# Patient Record
Sex: Female | Born: 1960 | Race: White | Hispanic: No | Marital: Married | Smoking: Current every day smoker
Health system: Southern US, Community
[De-identification: ages and names within clinical notes are randomized; demographics above are authoritative.]

## PROBLEM LIST (undated history)

## (undated) DIAGNOSIS — M5030 Other cervical disc degeneration, unspecified cervical region: Secondary | ICD-10-CM

## (undated) DIAGNOSIS — E079 Disorder of thyroid, unspecified: Secondary | ICD-10-CM

## (undated) HISTORY — PX: OTHER SURGICAL HISTORY: SHX169

---

## 2017-09-30 ENCOUNTER — Observation Stay
Admission: EM | Admit: 2017-09-30 | Discharge: 2017-10-02 | Disposition: A | Discharge status: Home / Self Care | Payer: BLUE CROSS/BLUE SHIELD | Attending: Internal Medicine | Admitting: Internal Medicine

## 2017-09-30 ENCOUNTER — Encounter: Payer: Self-pay | Admitting: Emergency Medicine

## 2017-09-30 DIAGNOSIS — M7989 Other specified soft tissue disorders: Secondary | ICD-10-CM | POA: Diagnosis present

## 2017-09-30 DIAGNOSIS — E039 Hypothyroidism, unspecified: Secondary | ICD-10-CM | POA: Insufficient documentation

## 2017-09-30 DIAGNOSIS — I10 Essential (primary) hypertension: Secondary | ICD-10-CM | POA: Insufficient documentation

## 2017-09-30 DIAGNOSIS — F17200 Nicotine dependence, unspecified, uncomplicated: Secondary | ICD-10-CM | POA: Insufficient documentation

## 2017-09-30 DIAGNOSIS — L03113 Cellulitis of right upper limb: Secondary | ICD-10-CM | POA: Diagnosis not present

## 2017-09-30 DIAGNOSIS — R609 Edema, unspecified: Secondary | ICD-10-CM

## 2017-09-30 DIAGNOSIS — L0390 Cellulitis, unspecified: Secondary | ICD-10-CM | POA: Diagnosis present

## 2017-09-30 HISTORY — DX: Disorder of thyroid, unspecified: E07.9

## 2017-09-30 HISTORY — DX: Other cervical disc degeneration, unspecified cervical region: M50.30

## 2017-09-30 NOTE — ED Triage Notes (Signed)
Patient with redness, swelling and blister to right arm that started this morning that has become worse throughout the day. Patient with positive radial pulse.

## 2017-10-01 ENCOUNTER — Emergency Department: Payer: BLUE CROSS/BLUE SHIELD

## 2017-10-01 ENCOUNTER — Encounter: Payer: Self-pay | Admitting: Radiology

## 2017-10-01 DIAGNOSIS — L03113 Cellulitis of right upper limb: Secondary | ICD-10-CM | POA: Diagnosis not present

## 2017-10-01 DIAGNOSIS — L0390 Cellulitis, unspecified: Secondary | ICD-10-CM | POA: Diagnosis present

## 2017-10-01 LAB — CBC WITH DIFFERENTIAL/PLATELET
BASOS ABS: 0 10*3/uL (ref 0–0.1)
BASOS PCT: 0 %
Eosinophils Absolute: 0.1 10*3/uL (ref 0–0.7)
Eosinophils Relative: 1 %
HEMATOCRIT: 37.6 % (ref 35.0–47.0)
HEMOGLOBIN: 13.2 g/dL (ref 12.0–16.0)
Lymphocytes Relative: 24 %
Lymphs Abs: 2.5 10*3/uL (ref 1.0–3.6)
MCH: 31.2 pg (ref 26.0–34.0)
MCHC: 35.2 g/dL (ref 32.0–36.0)
MCV: 88.6 fL (ref 80.0–100.0)
Monocytes Absolute: 0.5 10*3/uL (ref 0.2–0.9)
Monocytes Relative: 5 %
NEUTROS ABS: 7.1 10*3/uL — AB (ref 1.4–6.5)
NEUTROS PCT: 70 %
Platelets: 312 10*3/uL (ref 150–440)
RBC: 4.25 MIL/uL (ref 3.80–5.20)
RDW: 13.8 % (ref 11.5–14.5)
WBC: 10.2 10*3/uL (ref 3.6–11.0)

## 2017-10-01 LAB — BASIC METABOLIC PANEL
Anion gap: 13 (ref 5–15)
BUN: 15 mg/dL (ref 6–20)
CHLORIDE: 100 mmol/L — AB (ref 101–111)
CO2: 24 mmol/L (ref 22–32)
Calcium: 9.5 mg/dL (ref 8.9–10.3)
Creatinine, Ser: 0.76 mg/dL (ref 0.44–1.00)
GFR calc non Af Amer: >60 mL/min (ref >60)
Glucose, Bld: 105 mg/dL — ABNORMAL HIGH (ref 65–99)
POTASSIUM: 3.5 mmol/L (ref 3.5–5.1)
Sodium: 137 mmol/L (ref 135–145)

## 2017-10-01 LAB — SEDIMENTATION RATE: SED RATE: 16 mm/h (ref 0–30)

## 2017-10-01 LAB — LACTIC ACID, PLASMA: LACTIC ACID, VENOUS: 0.7 mmol/L (ref 0.5–1.9)

## 2017-10-01 MED ORDER — ENOXAPARIN SODIUM 40 MG/0.4ML ~~LOC~~ SOLN
40.0000 mg | SUBCUTANEOUS | Status: DC
  Administered 2017-10-01: 40 mg via SUBCUTANEOUS
  Filled 2017-10-01: qty 0.4

## 2017-10-01 MED ORDER — VANCOMYCIN HCL IN DEXTROSE 1-5 GM/200ML-% IV SOLN
1000.0000 mg | Freq: Once | INTRAVENOUS | Status: AC
  Administered 2017-10-01: 1000 mg via INTRAVENOUS
  Filled 2017-10-01: qty 200

## 2017-10-01 MED ORDER — SODIUM CHLORIDE 0.9% FLUSH: 3.0000 mL | INTRAVENOUS | Status: DC | PRN

## 2017-10-01 MED ORDER — PREDNISONE 20 MG PO TABS
40.0000 mg | ORAL_TABLET | Freq: Every day | ORAL | Status: DC
  Administered 2017-10-01 – 2017-10-02 (×2): 40 mg via ORAL
  Filled 2017-10-01 (×2): qty 2

## 2017-10-01 MED ORDER — IOPAMIDOL (ISOVUE-370) INJECTION 76%
100.0000 mL | Freq: Once | INTRAVENOUS | Status: AC | PRN
  Administered 2017-10-01: 100 mL via INTRAVENOUS

## 2017-10-01 MED ORDER — MORPHINE SULFATE (PF) 2 MG/ML IV SOLN
2.0000 mg | Freq: Once | INTRAVENOUS | Status: AC
  Administered 2017-10-01: 2 mg via INTRAVENOUS
  Filled 2017-10-01: qty 1

## 2017-10-01 MED ORDER — ONDANSETRON HCL 4 MG/2ML IJ SOLN: 4.0000 mg | Freq: Four times a day (QID) | INTRAMUSCULAR | Status: DC | PRN

## 2017-10-01 MED ORDER — HYDROCODONE-ACETAMINOPHEN 5-325 MG PO TABS
1.0000 | ORAL_TABLET | ORAL | Status: DC | PRN
  Administered 2017-10-01: 2 via ORAL
  Administered 2017-10-01 (×2): 1 via ORAL
  Administered 2017-10-02 (×2): 2 via ORAL
  Filled 2017-10-01 (×2): qty 1
  Filled 2017-10-01 (×3): qty 2

## 2017-10-01 MED ORDER — DIPHENHYDRAMINE HCL 25 MG PO CAPS: 25.0000 mg | ORAL_CAPSULE | Freq: Four times a day (QID) | ORAL | Status: DC | PRN

## 2017-10-01 MED ORDER — ACETAMINOPHEN 325 MG PO TABS: 650.0000 mg | ORAL_TABLET | Freq: Four times a day (QID) | ORAL | Status: DC | PRN

## 2017-10-01 MED ORDER — ONDANSETRON HCL 4 MG PO TABS: 4.0000 mg | ORAL_TABLET | Freq: Four times a day (QID) | ORAL | Status: DC | PRN

## 2017-10-01 MED ORDER — KETOROLAC TROMETHAMINE 30 MG/ML IJ SOLN
30.0000 mg | Freq: Four times a day (QID) | INTRAMUSCULAR | Status: DC | PRN
  Administered 2017-10-01: 30 mg via INTRAVENOUS
  Filled 2017-10-01: qty 1

## 2017-10-01 MED ORDER — ACETAMINOPHEN 650 MG RE SUPP: 650.0000 mg | Freq: Four times a day (QID) | RECTAL | Status: DC | PRN

## 2017-10-01 MED ORDER — VANCOMYCIN HCL IN DEXTROSE 1-5 GM/200ML-% IV SOLN
1000.0000 mg | Freq: Two times a day (BID) | INTRAVENOUS | Status: DC
  Administered 2017-10-01 – 2017-10-02 (×3): 1000 mg via INTRAVENOUS
  Filled 2017-10-01 (×4): qty 200

## 2017-10-01 MED ORDER — SODIUM CHLORIDE 0.9 % IV SOLN: 250.0000 mL | INTRAVENOUS | Status: DC | PRN

## 2017-10-01 MED ORDER — VANCOMYCIN HCL IN DEXTROSE 1-5 GM/200ML-% IV SOLN: 1000.0000 mg | Freq: Once | INTRAVENOUS | Status: DC

## 2017-10-01 MED ORDER — FENTANYL CITRATE (PF) 100 MCG/2ML IJ SOLN
25.0000 ug | Freq: Once | INTRAMUSCULAR | Status: AC
  Administered 2017-10-01: 25 ug via INTRAVENOUS
  Filled 2017-10-01: qty 2

## 2017-10-01 MED ORDER — SENNOSIDES-DOCUSATE SODIUM 8.6-50 MG PO TABS: 1.0000 | ORAL_TABLET | Freq: Every evening | ORAL | Status: DC | PRN

## 2017-10-01 MED ORDER — SODIUM CHLORIDE 0.9% FLUSH
3.0000 mL | Freq: Two times a day (BID) | INTRAVENOUS | Status: DC
  Administered 2017-10-01 – 2017-10-02 (×3): 3 mL via INTRAVENOUS

## 2017-10-01 MED ORDER — ONDANSETRON HCL 4 MG/2ML IJ SOLN
4.0000 mg | Freq: Once | INTRAMUSCULAR | Status: AC
  Administered 2017-10-01: 4 mg via INTRAVENOUS
  Filled 2017-10-01: qty 2

## 2017-10-01 MED ORDER — THYROID 60 MG PO TABS
60.0000 mg | ORAL_TABLET | Freq: Every day | ORAL | Status: DC
  Administered 2017-10-01 – 2017-10-02 (×2): 60 mg via ORAL
  Filled 2017-10-01 (×4): qty 1

## 2017-10-01 NOTE — Progress Notes (Signed)
The patient complains of blisters and pain on right arm. She said there were some hives and itchon the right arm initially. She denies any contact with plant such as poison ivy. Her vital signs are stable. Physical examination she is unremarkable except blisters and swelling on right arm.  Continue current treatment with vancomycin. Add prednisone and Benadryl when necessary. Pain control.  Time spent 20 minutes.

## 2017-10-01 NOTE — ED Notes (Signed)
Pt transported to room 219 

## 2017-10-01 NOTE — H&P (Signed)
[facility] - [place] [facility]   PATIENT NAME: Crystal Olson    MR#:  [id number]  DATE OF BIRTH:  03-23-1961  DATE OF ADMISSION:  09/30/2017  PRIMARY CARE PHYSICIAN: System, Pcp Not In   REQUESTING/REFERRING PHYSICIAN:   CHIEF COMPLAINT:   Chief Complaint  Patient presents with  . Cellulitis  . Arm Swelling    HISTORY OF PRESENT ILLNESS: Crystal Olson  is a 56 y.o. female with a known history of degenerative disc disease, thyroid disease presented to the emergency room with right forearm swelling. Swelling is present from the wrist up to the lower third of the right arm.The right upper extremity is tense secondary to swelling. Pain is noted which is aching 7 out of 10 on a scale of 1-10.patient traveled from [place] to [place] and en route stayed in [place] at the hotel. Does not remember whether she ever had any insect bite. The swelling in the right upper extremity was noted yesterday morning. It gradually increased in size by evening.After patient presented to the emergency room she also developed 2 blisters over the right upper extremity. Case was seen by surgical attending in the emergency room who recommended no surgical intervention. Patient was worked up with CT angiogram of the right upper extremity which showed no thrombosis. Patient was started on IV antiemetics for cellulitis.  PAST MEDICAL HISTORY:   Past Medical History:  Diagnosis Date  . DDD (degenerative disc disease), cervical   . Thyroid disease     PAST SURGICAL HISTORY: Past Surgical History:  Procedure Laterality Date  . none      SOCIAL HISTORY:  Social History  Substance Use Topics  . Smoking status: Current Every Day Smoker  . Smokeless tobacco: Never Used  . Alcohol use No    FAMILY HISTORY:  Family History  Problem Relation Age of Onset  . Heart disease Mother   . Diabetes Mellitus II Father   . Hypertension Father     DRUG ALLERGIES:  Allergies   Allergen Reactions  . Erythromycin   . Penicillins   . Tetracycline     Other reaction(s): fungus on tongue    REVIEW OF SYSTEMS:   CONSTITUTIONAL: No fever, fatigue or weakness.  EYES: No blurred or double vision.  EARS, NOSE, AND THROAT: No tinnitus or ear pain.  RESPIRATORY: No cough, shortness of breath, wheezing or hemoptysis.  CARDIOVASCULAR: No chest pain, orthopnea, edema.  GASTROINTESTINAL: No nausea, vomiting, diarrhea or abdominal pain.  GENITOURINARY: No dysuria, hematuria.  ENDOCRINE: No polyuria, nocturia,  HEMATOLOGY: No anemia, easy bruising or bleeding SKIN: Blisters over right fore arm MUSCULOSKELETAL: No joint pain or arthritis. Has swelling, pain right fore arm   NEUROLOGIC: No tingling, numbness, weakness.  PSYCHIATRY: No anxiety or depression.   MEDICATIONS AT HOME:  Prior to Admission medications   Medication Sig Start Date End Date Taking? Authorizing Provider  HYDROcodone-acetaminophen (NORCO) 7.5-325 MG tablet Take 1 tablet by mouth every 6 (six) hours as needed. 09/07/17  Yes [provider]  NP THYROID 60 MG tablet Take 1 tablet by mouth daily. 09/26/17  Yes [provider]      PHYSICAL EXAMINATION:   VITAL SIGNS: Blood pressure 113/64, pulse 65, temperature 98.6 F (37 C), temperature source Oral, resp. rate 16, height  (1.575 m), weight 68.9 kg (152 lb), SpO2 97 %.  GENERAL:  56 y.o.-year-old patient lying in the bed with no acute distress.  EYES: Pupils equal, round, reactive to light  and accommodation. No scleral icterus. Extraocular muscles intact.  HEENT: Head atraumatic, normocephalic. Oropharynx and nasopharynx clear.  NECK:  Supple, no jugular venous distention. No thyroid enlargement, no tenderness.  LUNGS: Normal breath sounds bilaterally, no wheezing, rales,rhonchi or crepitation. No use of accessory muscles of respiration.  CARDIOVASCULAR: S1, S2 normal. No murmurs, rubs, or gallops.  ABDOMEN: Soft, nontender,  nondistended. Bowel sounds present. No organomegaly or mass.  EXTREMITIES: No pedal edema, cyanosis, or clubbing.  Has swelling of right fore arm extending from wrist to lower 1/3 rd of arm Blisters noted over right fore arm NEUROLOGIC: Cranial nerves II through XII are intact. Muscle strength 5/5 in all extremities. Sensation intact. Gait not checked.  PSYCHIATRIC: The patient is alert and oriented x 3.  SKIN: No obvious rash, lesion, or ulcer.   LABORATORY PANEL:   CBC  Recent Labs Lab 10/01/17 0104  WBC 10.2  HGB 13.2  HCT 37.6  PLT 312  MCV 88.6  MCH 31.2  MCHC 35.2  RDW 13.8  LYMPHSABS 2.5  MONOABS 0.5  EOSABS 0.1  BASOSABS 0.0   ------------------------------------------------------------------------------------------------------------------  Chemistries   Recent Labs Lab 10/01/17 0104  NA 137  K 3.5  CL 100*  CO2 24  GLUCOSE 105*  BUN 15  CREATININE 0.76  CALCIUM 9.5   ------------------------------------------------------------------------------------------------------------------ estimated creatinine clearance is 70.6 mL/min (by C-G formula based on SCr of 0.76 mg/dL). ------------------------------------------------------------------------------------------------------------------ No results for input(s): TSH, T4TOTAL, T3FREE, THYROIDAB in the last 72 hours.  Invalid input(s): FREET3   Coagulation profile No results for input(s): INR, PROTIME in the last 168 hours. ------------------------------------------------------------------------------------------------------------------- No results for input(s): DDIMER in the last 72 hours. -------------------------------------------------------------------------------------------------------------------  Cardiac Enzymes No results for input(s): CKMB, TROPONINI, MYOGLOBIN in the last 168 hours.  Invalid input(s):  CK ------------------------------------------------------------------------------------------------------------------ Invalid input(s): POCBNP  ---------------------------------------------------------------------------------------------------------------  Urinalysis No results found for: COLORURINE, APPEARANCEUR, LABSPEC, PHURINE, GLUCOSEU, HGBUR, BILIRUBINUR, KETONESUR, PROTEINUR, UROBILINOGEN, NITRITE, LEUKOCYTESUR   RADIOLOGY: Ct Angio Up Extrem Right W &/or Wo Contrast  Result Date: 10/01/2017 CLINICAL DATA:  Acute onset of right arm erythema, swelling and blistering. Initial encounter. EXAM: CT ANGIOGRAPHY OF THE RIGHT UPPER EXTREMITY TECHNIQUE: Multidetector CT imaging of the right upper extremity was performed using the standard protocol during bolus administration of intravenous contrast. Multiplanar CT image reconstructions and MIPs were obtained to evaluate the vascular anatomy. CONTRAST:  100 mL of Isovue 370 IV contrast COMPARISON:  Right upper extremity venous Doppler ultrasound performed earlier today at 1:33 a.m. FINDINGS: There is no evidence of acute arterial thrombosis. The arterial vasculature appears fully patent. No calcific atherosclerotic disease is seen. Mild diffuse soft tissue edema is noted about the upper arm, posterior elbow and mid to distal forearm. Focal blisters are noted along the volar aspect of the forearm, measuring up to 2.5 cm in size. No abnormal focal fluid collections are otherwise seen. There is no evidence of abscess. The musculature is otherwise grossly unremarkable in appearance. No acute osseous abnormalities are seen. Subcortical cysts are noted along the lateral right humeral head. Review of the MIP images confirms the above findings. IMPRESSION: 1. No evidence of acute arterial thrombosis. No calcific atherosclerotic disease seen. 2. Diffuse soft tissue edema about the upper arm, posterior elbow and mid to distal forearm. Focal blisters along the volar  aspect of the forearm, measuring up to 2.5 cm in size. Electronically Signed   By: Roanna Raider M.D.   On: 10/01/2017 02:58   US Venous Img Upper Uni Right  Result Date:  10/01/2017 CLINICAL DATA:  Upper extremity swelling and cellulitis EXAM: Right UPPER EXTREMITY VENOUS DOPPLER ULTRASOUND TECHNIQUE: Gray-scale sonography with graded compression, as well as color Doppler and duplex ultrasound were performed to evaluate the upper extremity deep venous system from the level of the subclavian vein and including the jugular, axillary, basilic, radial, ulnar and upper cephalic vein. Spectral Doppler was utilized to evaluate flow at rest and with distal augmentation maneuvers. COMPARISON:  None. FINDINGS: Contralateral Subclavian Vein: Respiratory phasicity is normal and symmetric with the symptomatic side. No evidence of thrombus. Normal compressibility. Internal Jugular Vein: No evidence of thrombus. Normal compressibility, respiratory phasicity and response to augmentation. Subclavian Vein: No evidence of thrombus. Normal compressibility, respiratory phasicity and response to augmentation. Axillary Vein: No evidence of thrombus. Normal compressibility, respiratory phasicity and response to augmentation. Cephalic Vein: No evidence of thrombus. Normal compressibility, respiratory phasicity and response to augmentation. Basilic Vein: No evidence of thrombus. Normal compressibility, respiratory phasicity and response to augmentation. Brachial Veins: No evidence of thrombus. Normal compressibility, respiratory phasicity and response to augmentation. Radial Veins: No evidence of thrombus. Normal compressibility, respiratory phasicity and response to augmentation. Ulnar Veins: No evidence of thrombus. Normal compressibility, respiratory phasicity and response to augmentation. Venous Reflux:  None visualized. Other Findings:  None visualized. IMPRESSION: No evidence of DVT within the right upper extremity.  Electronically Signed   By: Ellery Plunk M.D.   On: 10/01/2017 01:45    EKG: No orders found for this or any previous visit.  IMPRESSION AND PLAN: 56 year old female patient with history of thyroid disease presented to the emergency room with swelling of the right forearm extending up to the arm and blisters of the skin.  Admitting diagnosis 1. Right upper extremity cellulitis 2. Right arm pain 3. Thyroid disease Treatment plan Admit patient to medical floor Start patient on IV vancomycin antibiotic Elevate the right upper extremity Appreciate surgical consultation Pain management with IV ketorolac  All the records are reviewed and case discussed with ED provider. Management plans discussed with the patient, family and they are in agreement.  CODE STATUS:FULL CODE Code Status History    This patient does not have a recorded code status. Please follow your organizational policy for patients in this situation.       TOTAL TIME TAKING CARE OF THIS PATIENT: 50 minutes.    Ihor Austin M.D on 10/01/2017 at 6:27 AM  Between 7am to 6pm - Pager - [phone]  After 6pm go to [email or web address] - password [email or web address]  [facility]  Office  [phone]  CC: Primary care physician; System, Pcp Not In
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
## 2017-10-01 NOTE — ED Notes (Signed)
David RN, aware of bed assigned  

## 2017-10-01 NOTE — Progress Notes (Signed)
Pharmacy Antibiotic Note  Crystal Olson is a 56 y.o. female admitted on 09/30/2017 with cellulitis.  Pharmacy has been consulted for vancomycin dosing.  Plan: Patient received vanc 1g IV x 1 in ED. Will f/u w/ vanc 1g IV q12h w/ 6 hr stack dose. Will draw vanc trough 10/15 @ 1800 prior to 4th dose.  Ke 0.06299 T1/2 12 hrs Goal trough 10 - 15 mcg/mL  Height:  (157.5 cm) Weight: 152 lb (68.9 kg) IBW/kg (Calculated) : 50.1  Temp (24hrs), Avg:98.6 F (37 C), Min:98.6 F (37 C), Max:98.6 F (37 C)   Recent Labs Lab 10/01/17 0104  WBC 10.2  CREATININE 0.76  LATICACIDVEN 0.7    Estimated Creatinine Clearance: 70.6 mL/min (by C-G formula based on SCr of 0.76 mg/dL).    Allergies  Allergen Reactions  . Erythromycin   . Penicillins   . Tetracycline     Other reaction(s): fungus on tongue    Thank you for allowing pharmacy to be a part of this patient's care.  Thomasene Ripple, PharmD, BCPS Clinical Pharmacist 10/01/2017

## 2017-10-01 NOTE — ED Notes (Signed)
ED Provider at bedside. 

## 2017-10-01 NOTE — ED Notes (Signed)
Patient transported to CT 

## 2017-10-02 LAB — CBC
HCT: 37.7 % (ref 35.0–47.0)
HEMOGLOBIN: 12.7 g/dL (ref 12.0–16.0)
MCH: 30.2 pg (ref 26.0–34.0)
MCHC: 33.6 g/dL (ref 32.0–36.0)
MCV: 89.8 fL (ref 80.0–100.0)
Platelets: 281 10*3/uL (ref 150–440)
RBC: 4.2 MIL/uL (ref 3.80–5.20)
RDW: 13.7 % (ref 11.5–14.5)
WBC: 11.7 10*3/uL — ABNORMAL HIGH (ref 3.6–11.0)

## 2017-10-02 LAB — BASIC METABOLIC PANEL
Anion gap: 9 (ref 5–15)
BUN: 10 mg/dL (ref 6–20)
CHLORIDE: 104 mmol/L (ref 101–111)
CO2: 27 mmol/L (ref 22–32)
Calcium: 9 mg/dL (ref 8.9–10.3)
Creatinine, Ser: 0.64 mg/dL (ref 0.44–1.00)
GFR calc Af Amer: >60 mL/min (ref >60)
GFR calc non Af Amer: >60 mL/min (ref >60)
GLUCOSE: 104 mg/dL — AB (ref 65–99)
POTASSIUM: 3.9 mmol/L (ref 3.5–5.1)
SODIUM: 140 mmol/L (ref 135–145)

## 2017-10-02 MED ORDER — SODIUM CHLORIDE 0.9 % IV SOLN
1.0000 g | Freq: Once | INTRAVENOUS | Status: DC
  Filled 2017-10-02: qty 1

## 2017-10-02 MED ORDER — CLINDAMYCIN HCL 150 MG PO CAPS
300.0000 mg | ORAL_CAPSULE | Freq: Three times a day (TID) | ORAL | Status: DC
  Filled 2017-10-02: qty 1
  Filled 2017-10-02: qty 2

## 2017-10-02 MED ORDER — PREDNISONE 10 MG PO TABS: 40.0000 mg | ORAL_TABLET | Freq: Every day | ORAL | 0 refills | Status: AC

## 2017-10-02 MED ORDER — CLINDAMYCIN HCL 300 MG PO CAPS: 300.0000 mg | ORAL_CAPSULE | Freq: Three times a day (TID) | ORAL | 0 refills | Status: AC

## 2017-10-02 MED ORDER — MEROPENEM 500 MG IV SOLR
500.0000 mg | Freq: Three times a day (TID) | INTRAVENOUS | Status: DC
  Administered 2017-10-02: 500 mg via INTRAVENOUS
  Filled 2017-10-02 (×3): qty 0.5

## 2017-10-02 NOTE — Discharge Instructions (Signed)
Patient advised to follow up PCP in [place]

## 2017-10-02 NOTE — Consult Note (Signed)
Pharmacy Antibiotic Note  Crystal Olson is a 56 y.o. female admitted on 09/30/2017 with cellulitis.  Pharmacy has been consulted for meropenem dosing. Patient presents w/ swelling and blisters of right arm.  Plan: meropenem  q 8 hours  Height:  (157.5 cm) Weight: 152 lb (68.9 kg) IBW/kg (Calculated) : 50.1  Temp (24hrs), Avg:97.9 F (36.6 C), Min:97.5 F (36.4 C), Max:98.2 F (36.8 C)   Recent Labs Lab 10/01/17 0104 10/02/17 0343  WBC 10.2 11.7*  CREATININE 0.76 0.64  LATICACIDVEN 0.7  --     Estimated Creatinine Clearance: 70.6 mL/min (by C-G formula based on SCr of 0.64 mg/dL).    Allergies  Allergen Reactions  . Erythromycin   . Penicillins   . Tetracycline     Other reaction(s): fungus on tongue    Antimicrobials this admission: vancomycin 10/14 >>  merropenem 10/15 >>   Dose adjustments this admission:   Microbiology results: 10/14 BCx: NG 1 day   Thank you for allowing pharmacy to be a part of this patient's care.  Olene Floss, Pharm.D, BCPS Clinical Pharmacist  10/02/2017 7:55 AM

## 2017-10-02 NOTE — Discharge Summary (Signed)
[facility] [place] [facility]   PATIENT NAME: Crystal Olson    MR#:  [id number]  DATE OF BIRTH:  03-29-1961  DATE OF ADMISSION:  09/30/2017 ADMITTING PHYSICIAN: Ihor Austin, MD  DATE OF DISCHARGE: 10/02/2017  PRIMARY CARE PHYSICIAN: System, Pcp Not In    ADMISSION DIAGNOSIS:  Swelling [R60.9] Cellulitis of right upper extremity [L03.113]  DISCHARGE DIAGNOSIS:  Right upper extremity cellulitis with blister Suspected right upper extremity allergic reaction-- unknown substance  SECONDARY DIAGNOSIS:   Past Medical History:  Diagnosis Date  . DDD (degenerative disc disease), cervical   . Thyroid disease     HOSPITAL COURSE:   Crystal Olson  is a 56 y.o. female with a known history of degenerative disc disease, thyroid disease presented to the emergency room with right forearm swelling. Swelling is present from the wrist up to the lower third of the right arm.The right upper extremity is tense secondary to swelling  1. Right upper extremity cellulitis with blister formation -Patient was traveling from which he gets to [place] and stayed in 2 different hotels. Do not know if she got a reaction to some unknown substance. Patient reported breaking out into hives and started having redness in right upper extremity. -She was started on IV vancomycin and meropenem was added by surgery( reason not known0 -Blood cultures negative -Redness improved remarkably. Patient has moderate-sized blisters. She was explained as blisters will open up eventually. We'll wrap it up with Band-Aid for now -Changed to oral clindamycin. Discussed with pharmacy _PATIENT AFEBRILE _Discussed with Dr. pabon--no surgical recommendation no indication for popping the blisters  2.hypothyroidism -Continue thyroid supplements  Overall improved. We'll discharge to home. Patient advised to follow-up with PCP in [place] CONSULTS OBTAINED:  Treatment Team:  Ancil Linsey,  MD  DRUG ALLERGIES:   Allergies  Allergen Reactions  . Erythromycin   . Penicillins   . Tetracycline     Other reaction(s): fungus on tongue    DISCHARGE MEDICATIONS:   Current Discharge Medication List    START taking these medications   Details  clindamycin (CLEOCIN) 300 MG capsule Take 1 capsule (300 mg total) by mouth every 8 (eight) hours. Qty: 18 capsule, Refills: 0    predniSONE (DELTASONE) 10 MG tablet Take 4 tablets (40 mg total) by mouth daily with breakfast. Start 40 mg daily taper by 10 mg daily then stop Qty: 10 tablet, Refills: 0      CONTINUE these medications which have NOT CHANGED   Details  HYDROcodone-acetaminophen (NORCO) 7.5-325 MG tablet Take 1 tablet by mouth every 6 (six) hours as needed. Refills: 0    NP THYROID 60 MG tablet Take 1 tablet by mouth daily. Refills: 10        If you experience worsening of your admission symptoms, develop shortness of breath, life threatening emergency, suicidal or homicidal thoughts you must seek medical attention immediately by calling 911 or calling your MD immediately  if symptoms less severe.  You Must read complete instructions/literature along with all the possible adverse reactions/side effects for all the Medicines you take and that have been prescribed to you. Take any new Medicines after you have completely understood and accept all the possible adverse reactions/side effects.   Please note  You were cared for by a hospitalist during your hospital stay. If you have any questions about your discharge medications or the care you received while you were in the hospital after you are discharged, you can call the unit  and asked to speak with the hospitalist on call if the hospitalist that took care of you is not available. Once you are discharged, your primary care physician will handle any further medical issues. Please note that NO REFILLS for any discharge medications will be authorized once you are discharged,  as it is imperative that you return to your primary care physician (or establish a relationship with a primary care physician if you do not have one) for your aftercare needs so that they can reassess your need for medications and monitor your lab values. Today   SUBJECTIVE  Right upper extremity redness much improved   VITAL SIGNS:  Blood pressure (!) 112/53, pulse 60, temperature 98.2 F (36.8 C), temperature source Oral, resp. rate 16, height  (1.575 m), weight 68.9 kg (152 lb), SpO2 100 %.  I/O:   Intake/Output Summary (Last 24 hours) at 10/02/17 1151 Last data filed at 10/02/17 1013  Gross per 24 hour  Intake              840 ml  Output                0 ml  Net              840 ml    PHYSICAL EXAMINATION:  GENERAL:  56 y.o.-year-old patient lying in the bed with no acute distress.  EYES: Pupils equal, round, reactive to light and accommodation. No scleral icterus. Extraocular muscles intact.  HEENT: Head atraumatic, normocephalic. Oropharynx and nasopharynx clear.  NECK:  Supple, no jugular venous distention. No thyroid enlargement, no tenderness.  LUNGS: Normal breath sounds bilaterally, no wheezing, rales,rhonchi or crepitation. No use of accessory muscles of respiration.  CARDIOVASCULAR: S1, S2 normal. No murmurs, rubs, or gallops.  ABDOMEN: Soft, non-tender, non-distended. Bowel sounds present. No organomegaly or mass.  EXTREMITIES: No pedal edema, cyanosis, or clubbing. 2 moderate sized blisters present over the right forearm. redness resolved. NEUROLOGIC: Cranial nerves II through XII are intact. Muscle strength 5/5 in all extremities. Sensation intact. Gait not checked.  PSYCHIATRIC: The patient is alert and oriented x 3.  SKIN: No obvious rash, lesion, or ulcer.   DATA REVIEW:   CBC   Recent Labs Lab 10/02/17 0343  WBC 11.7*  HGB 12.7  HCT 37.7  PLT 281    Chemistries   Recent Labs Lab 10/02/17 0343  NA 140  K 3.9  CL 104  CO2 27  GLUCOSE 104*   BUN 10  CREATININE 0.64  CALCIUM 9.0    Microbiology Results   Recent Results (from the past 240 hour(s))  Culture, blood (routine x 2)     Status: None (Preliminary result)   Collection Time: 10/01/17  1:04 AM  Result Value Ref Range Status   Specimen Description BLOOD LEFT ANTECUBITAL  Final   Special Requests   Final    BOTTLES DRAWN AEROBIC AND ANAEROBIC Blood Culture adequate volume   Culture NO GROWTH 1 DAY  Final   Report Status PENDING  Incomplete  Culture, blood (routine x 2)     Status: None (Preliminary result)   Collection Time: 10/01/17  1:04 AM  Result Value Ref Range Status   Specimen Description BLOOD RIGHT HAND  Final   Special Requests   Final    BOTTLES DRAWN AEROBIC AND ANAEROBIC Blood Culture results may not be optimal due to an excessive volume of blood received in culture bottles   Culture NO GROWTH 1 DAY  Final  Report Status PENDING  Incomplete    RADIOLOGY:  Ct Angio Up Extrem Right W &/or Wo Contrast  Result Date: 10/01/2017 CLINICAL DATA:  Acute onset of right arm erythema, swelling and blistering. Initial encounter. EXAM: CT ANGIOGRAPHY OF THE RIGHT UPPER EXTREMITY TECHNIQUE: Multidetector CT imaging of the right upper extremity was performed using the standard protocol during bolus administration of intravenous contrast. Multiplanar CT image reconstructions and MIPs were obtained to evaluate the vascular anatomy. CONTRAST:  100 mL of Isovue 370 IV contrast COMPARISON:  Right upper extremity venous Doppler ultrasound performed earlier today at 1:33 a.m. FINDINGS: There is no evidence of acute arterial thrombosis. The arterial vasculature appears fully patent. No calcific atherosclerotic disease is seen. Mild diffuse soft tissue edema is noted about the upper arm, posterior elbow and mid to distal forearm. Focal blisters are noted along the volar aspect of the forearm, measuring up to 2.5 cm in size. No abnormal focal fluid collections are otherwise seen.  There is no evidence of abscess. The musculature is otherwise grossly unremarkable in appearance. No acute osseous abnormalities are seen. Subcortical cysts are noted along the lateral right humeral head. Review of the MIP images confirms the above findings. IMPRESSION: 1. No evidence of acute arterial thrombosis. No calcific atherosclerotic disease seen. 2. Diffuse soft tissue edema about the upper arm, posterior elbow and mid to distal forearm. Focal blisters along the volar aspect of the forearm, measuring up to 2.5 cm in size. Electronically Signed   By: Roanna Raider M.D.   On: 10/01/2017 02:58   US Venous Img Upper Uni Right  Result Date: 10/01/2017 CLINICAL DATA:  Upper extremity swelling and cellulitis EXAM: Right UPPER EXTREMITY VENOUS DOPPLER ULTRASOUND TECHNIQUE: Gray-scale sonography with graded compression, as well as color Doppler and duplex ultrasound were performed to evaluate the upper extremity deep venous system from the level of the subclavian vein and including the jugular, axillary, basilic, radial, ulnar and upper cephalic vein. Spectral Doppler was utilized to evaluate flow at rest and with distal augmentation maneuvers. COMPARISON:  None. FINDINGS: Contralateral Subclavian Vein: Respiratory phasicity is normal and symmetric with the symptomatic side. No evidence of thrombus. Normal compressibility. Internal Jugular Vein: No evidence of thrombus. Normal compressibility, respiratory phasicity and response to augmentation. Subclavian Vein: No evidence of thrombus. Normal compressibility, respiratory phasicity and response to augmentation. Axillary Vein: No evidence of thrombus. Normal compressibility, respiratory phasicity and response to augmentation. Cephalic Vein: No evidence of thrombus. Normal compressibility, respiratory phasicity and response to augmentation. Basilic Vein: No evidence of thrombus. Normal compressibility, respiratory phasicity and response to augmentation. Brachial  Veins: No evidence of thrombus. Normal compressibility, respiratory phasicity and response to augmentation. Radial Veins: No evidence of thrombus. Normal compressibility, respiratory phasicity and response to augmentation. Ulnar Veins: No evidence of thrombus. Normal compressibility, respiratory phasicity and response to augmentation. Venous Reflux:  None visualized. Other Findings:  None visualized. IMPRESSION: No evidence of DVT within the right upper extremity. Electronically Signed   By: Ellery Plunk M.D.   On: 10/01/2017 01:45     Management plans discussed with the patient, family and they are in agreement.  CODE STATUS:     Code Status Orders        Start     Ordered   10/01/17 0759  Full code  Continuous     10/01/17 0758    Code Status History    Date Active Date Inactive Code Status Order ID Comments User Context   This patient has  a current code status but no historical code status.      TOTAL TIME TAKING CARE OF THIS PATIENT: 40 minutes.    Chauncy Mangiaracina M.D on 10/02/2017 at 11:51 AM  Between 7am to 6pm - Pager - [phone] After 6pm go to [email or web address] - password [email or web address]  [facility]  Office  [phone]  CC: Primary care physician; System, Pcp Not In

## 2017-10-02 NOTE — Progress Notes (Signed)
Crystal Olson be D/C'd Home per MD order.  Discussed prescriptions and follow up appointments with the patient. Prescriptions given to patient, medication list explained in detail. Pt verbalized understanding.  Allergies as of 10/02/2017      Reactions   Erythromycin    Penicillins    Tetracycline    Other reaction(s): fungus on tongue      Medication List    TAKE these medications   clindamycin 300 MG capsule Commonly known as:  CLEOCIN Take 1 capsule (300 mg total) by mouth every 8 (eight) hours.   HYDROcodone-acetaminophen 7.5-325 MG tablet Commonly known as:  NORCO Take 1 tablet by mouth every 6 (six) hours as needed.   NP THYROID 60 MG tablet Generic drug:  thyroid Take 1 tablet by mouth daily.   predniSONE 10 MG tablet Commonly known as:  DELTASONE Take 4 tablets (40 mg total) by mouth daily with breakfast. Start 40 mg daily taper by 10 mg daily then stop       Vitals:   10/02/17 0400 10/02/17 1212  BP: (!) 112/53 128/65  Pulse: 60 62  Resp: 16 16  Temp: 98.2 F (36.8 C) 98.2 F (36.8 C)  SpO2: 100% 98%    Skin clean, dry and intact without evidence of skin break down, no evidence of skin tears noted. IV catheter discontinued intact. Site without signs and symptoms of complications. Dressing and pressure applied. Pt denies pain at this time. No complaints noted.  An After Visit Summary was printed and given to the patient. Patient escorted out with family, and D/C home via private auto.  Crystal Olson Crystal Olson 2

## 2017-10-06 LAB — CULTURE, BLOOD (ROUTINE X 2)
CULTURE: NO GROWTH
Culture: NO GROWTH
Special Requests: ADEQUATE

## 2018-04-13 LAB — HIV ANTIBODY (ROUTINE TESTING W REFLEX): HIV Screen 4th Generation wRfx: NONREACTIVE

## 2018-06-22 IMAGING — US US EXTREM  UP VENOUS*R*
1 series · 13 of 24 positions shown · non-contrast
Comparison: None.

CLINICAL DATA: Upper extremity swelling and cellulitis



[Series 1: us extrem up venous*right* · 0.08mm/px · 13 of 31 slices shown]
[im 1/31]
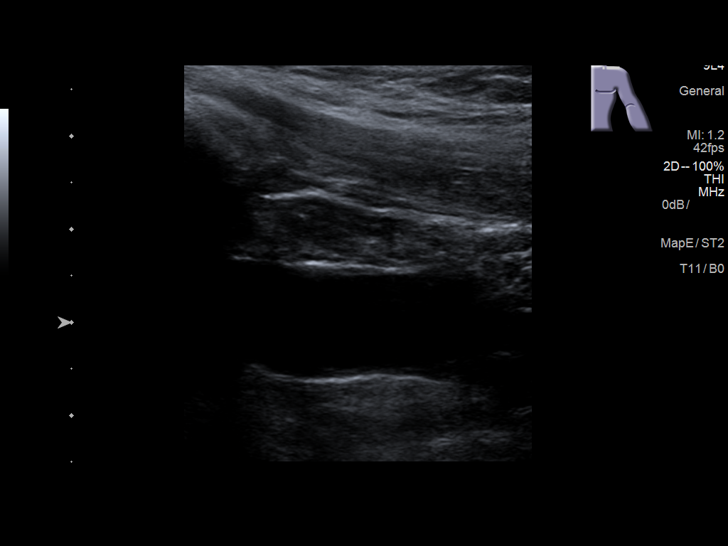
[im 3/31]
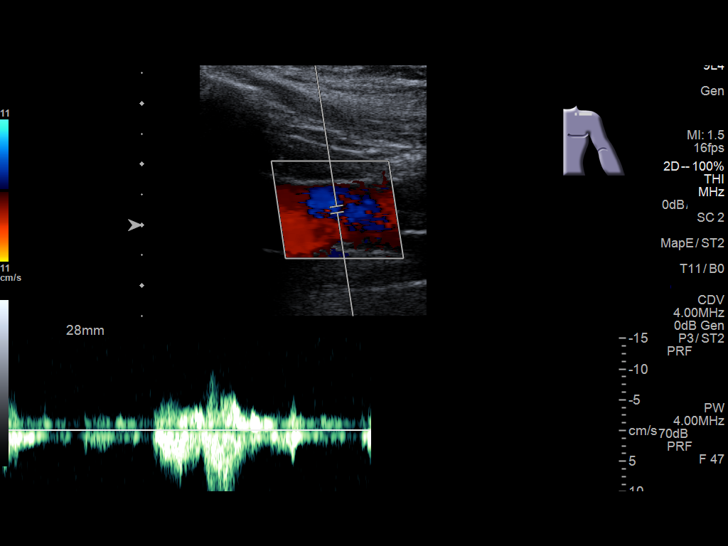
[im 6/31]
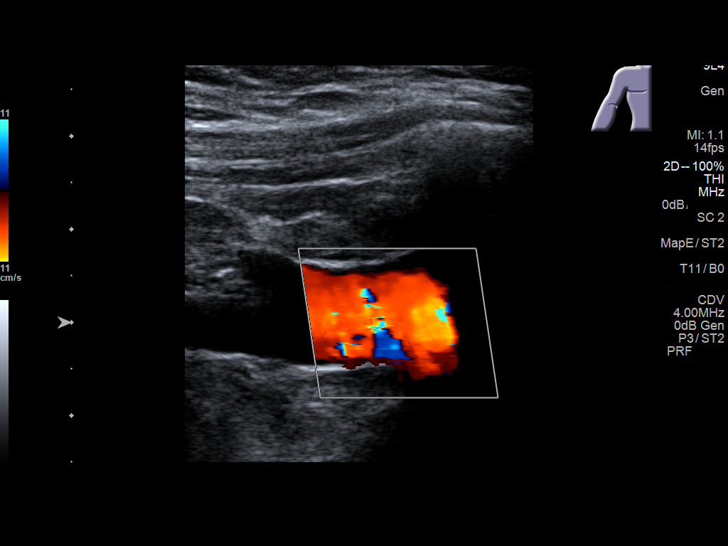
[im 8/31]
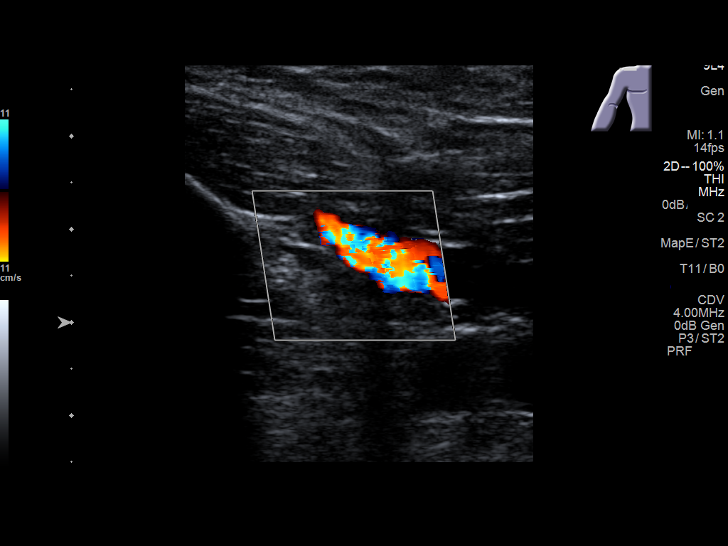
[im 11/31]
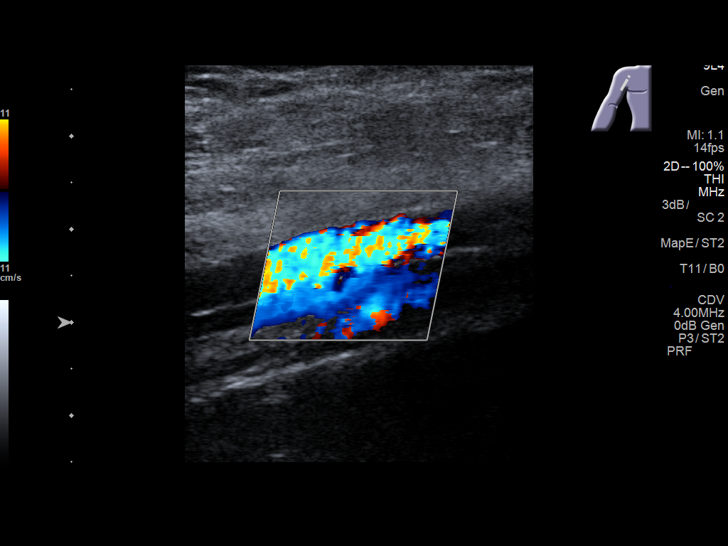
[im 14/31]
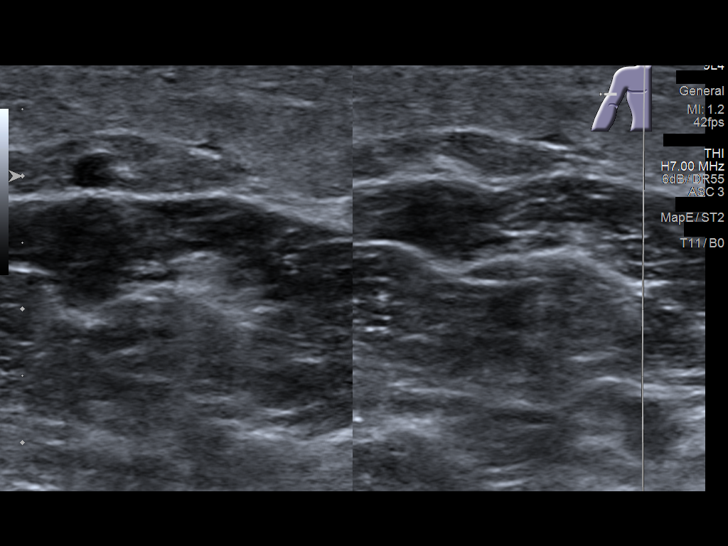
[im 16/31]
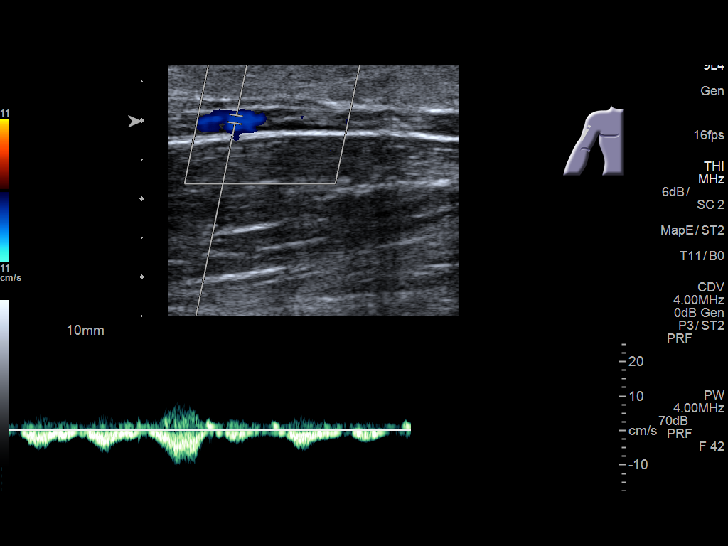
[im 17/31]
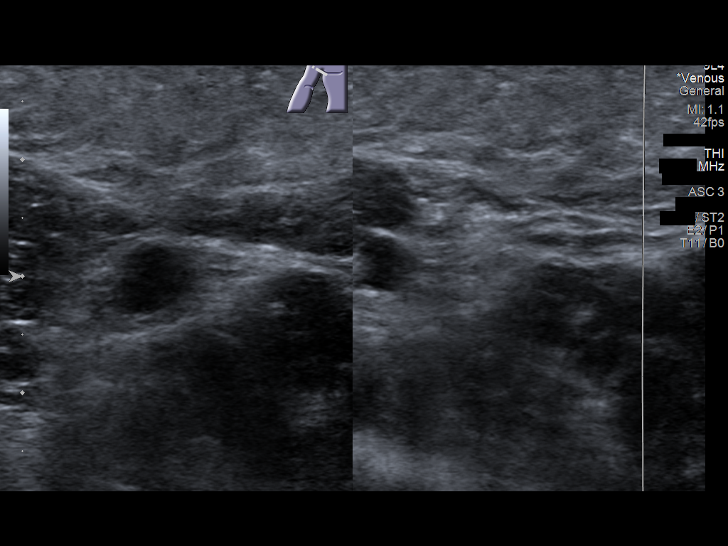
[im 20/31]
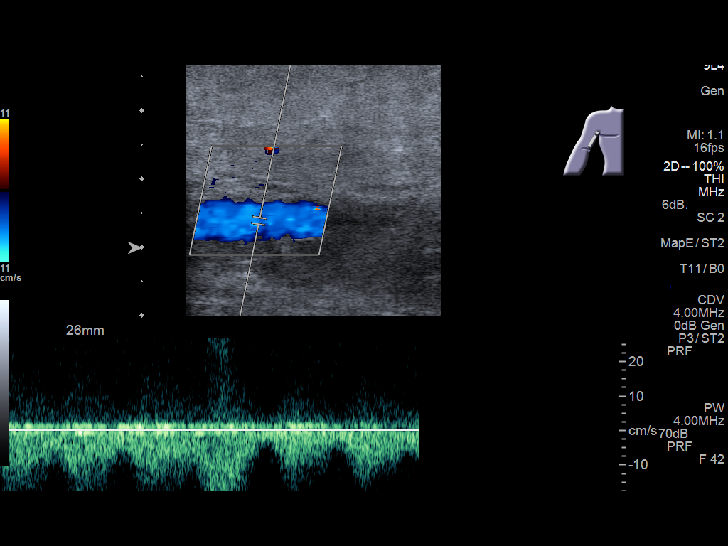
[im 23/31]
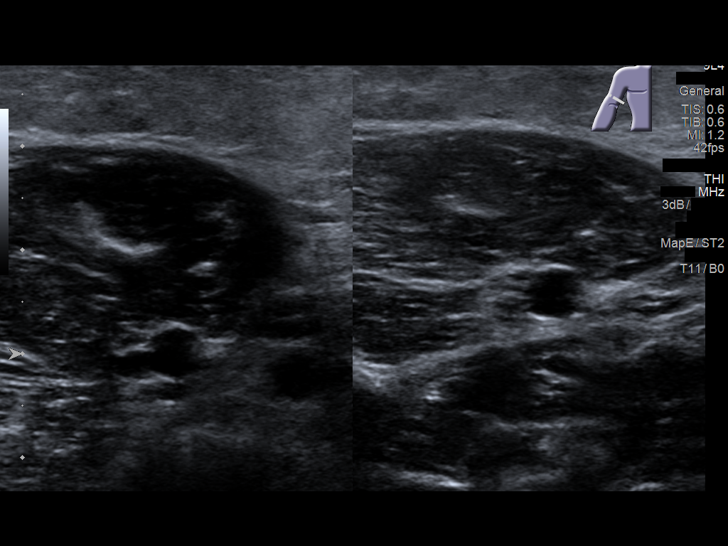
[im 25/31]
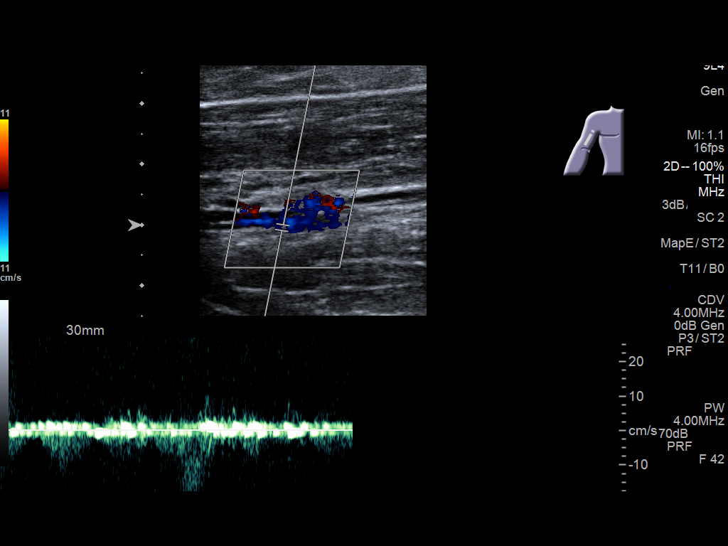
[im 28/31]
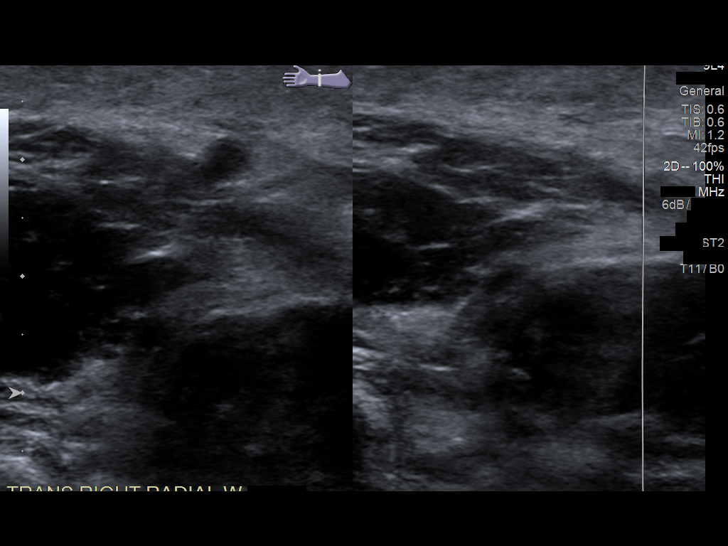
[im 31/31]
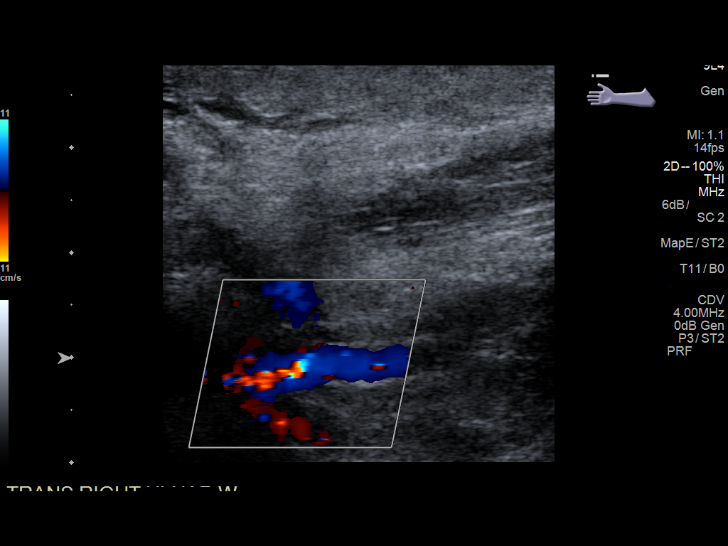

[13 of 24 positions shown; findings below may reference images not displayed]

FINDINGS: Contralateral Subclavian Vein: Respiratory phasicity is normal and
symmetric with the symptomatic side. No evidence of thrombus. Normal
compressibility.

Internal Jugular Vein: No evidence of thrombus. Normal
compressibility, respiratory phasicity and response to augmentation.

Subclavian Vein: No evidence of thrombus. Normal compressibility,
respiratory phasicity and response to augmentation.

Axillary Vein: No evidence of thrombus. Normal compressibility,
respiratory phasicity and response to augmentation.

Cephalic Vein: No evidence of thrombus. Normal compressibility,
respiratory phasicity and response to augmentation.

Basilic Vein: No evidence of thrombus. Normal compressibility,
respiratory phasicity and response to augmentation.

Brachial Veins: No evidence of thrombus. Normal compressibility,
respiratory phasicity and response to augmentation.

Radial Veins: No evidence of thrombus. Normal compressibility,
respiratory phasicity and response to augmentation.

Ulnar Veins: No evidence of thrombus. Normal compressibility,
respiratory phasicity and response to augmentation.

Venous Reflux:  None visualized.

Other Findings:  None visualized.
IMPRESSION: No evidence of DVT within the right upper extremity.

## 2018-10-07 IMAGING — CT CT ANGIO EXTREM UP*R*
2 of 5 series · 12 of 46 positions shown, 14 images · IV contrast (APPLIED)
Comparison: Right upper extremity venous Doppler ultrasound
performed earlier today at [DATE] a.m.

CLINICAL DATA: Acute onset of right arm erythema, swelling and
blistering. Initial encounter.

EXAM:
CT ANGIOGRAPHY OF THE RIGHT UPPER EXTREMITY
TECHNIQUE: Multidetector CT imaging of the right upper extremity was performed
using the standard protocol during bolus administration of
intravenous contrast. Multiplanar CT image reconstructions and MIPs
were obtained to evaluate the vascular anatomy.
CONTRAST:  100 mL of Isovue 370 IV contrast

[Series 7: axial arterial · axial · arterial · 0.27mm/px · z∈[-601,+29]mm · 9 of 242 slices shown, 11 images]
[im 16/242  soft-tissue]
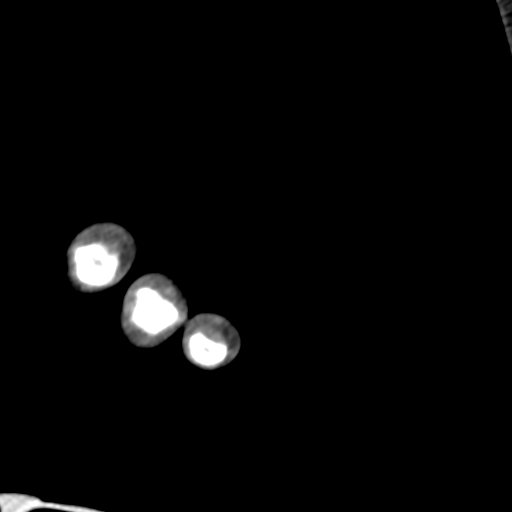
[im 16/242  bone]
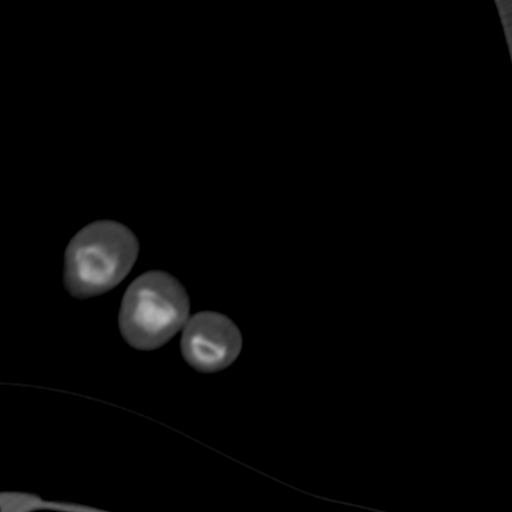
[im 47/242  soft-tissue]
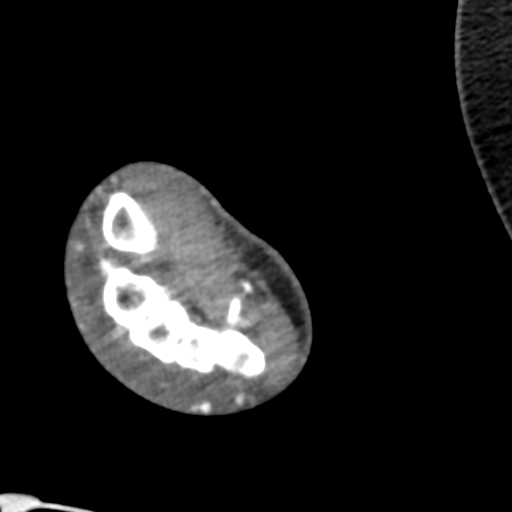
[im 70/242  soft-tissue]
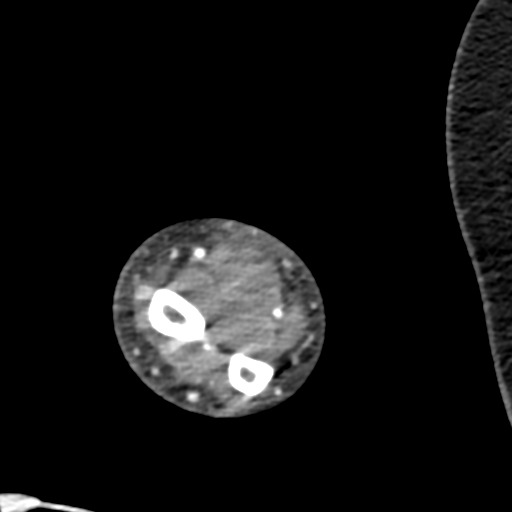
[im 94/242  soft-tissue]
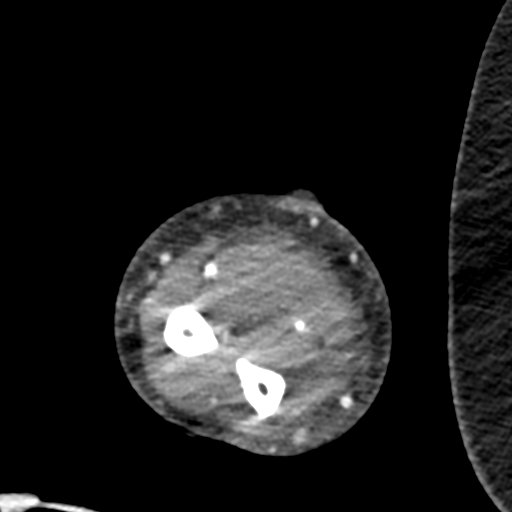
[im 125/242  soft-tissue]
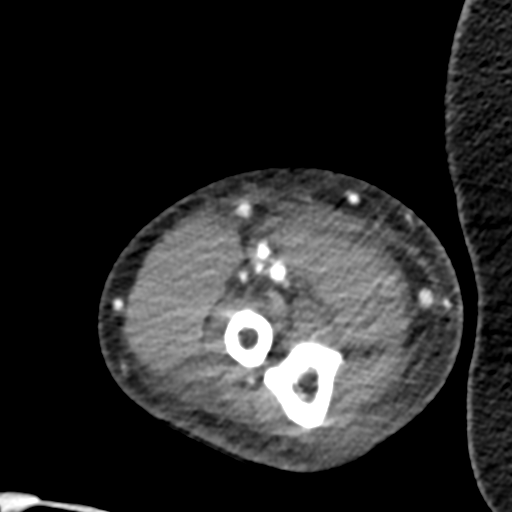
[im 148/242  soft-tissue]
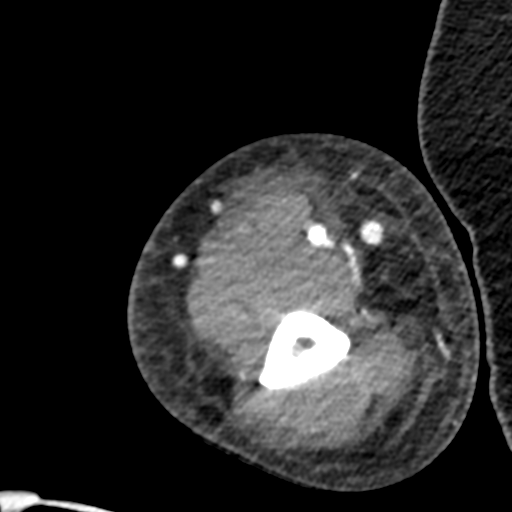
[im 172/242  soft-tissue]
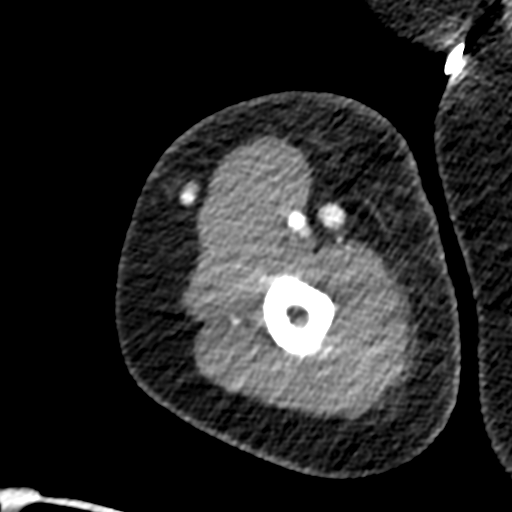
[im 203/242  soft-tissue]
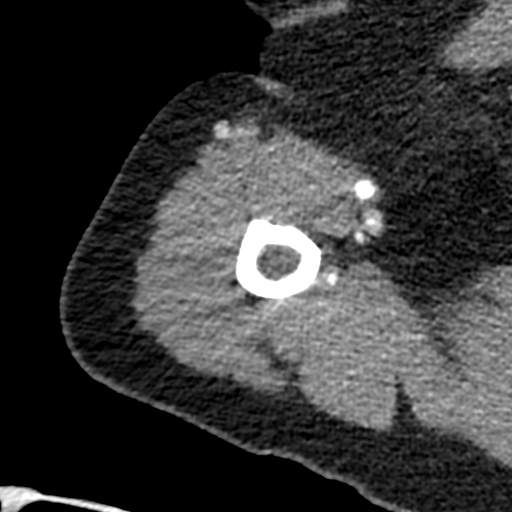
[im 226/242  soft-tissue]
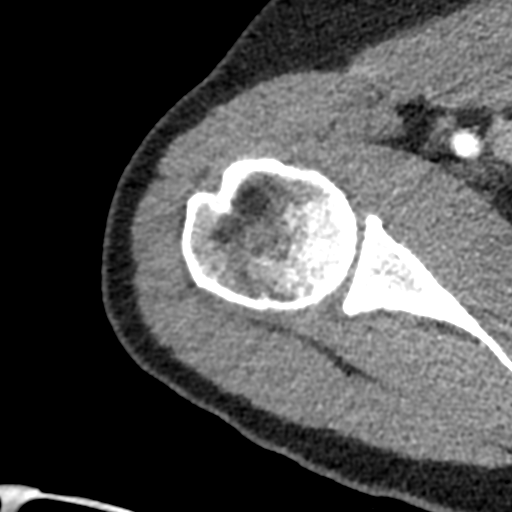
[im 226/242  bone]
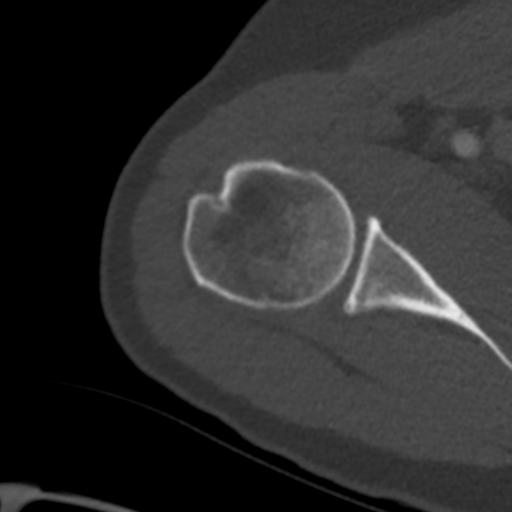

[Series 8: coronals · coronal · 0.52mm/px · 3 of 76 slices shown]
[im 19/76  soft-tissue]
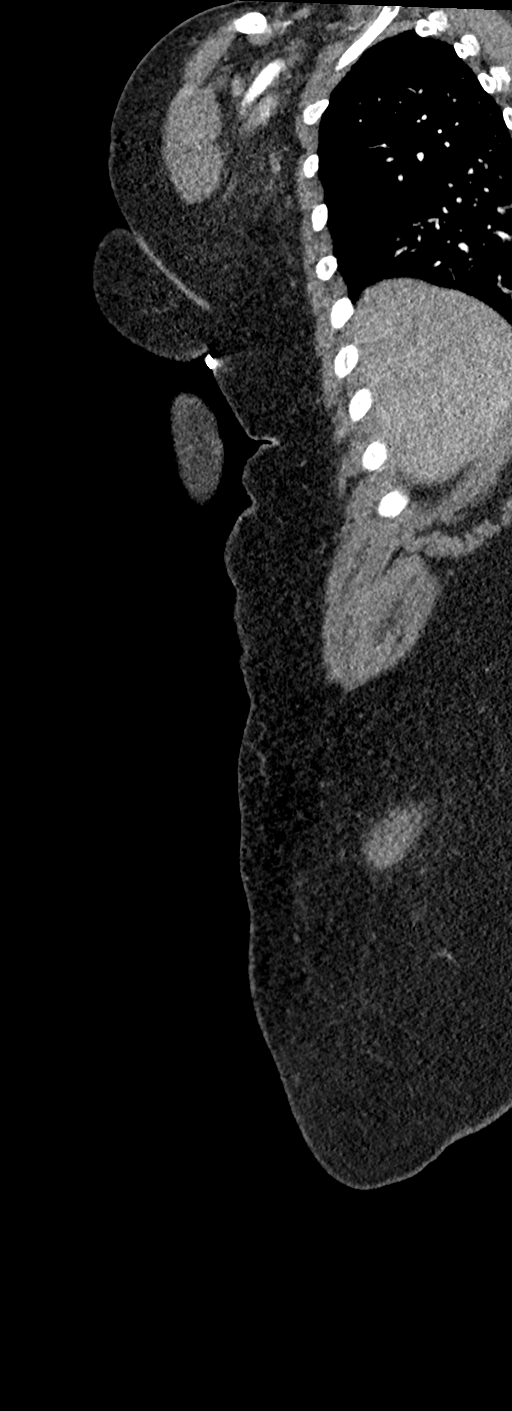
[im 38/76  soft-tissue]
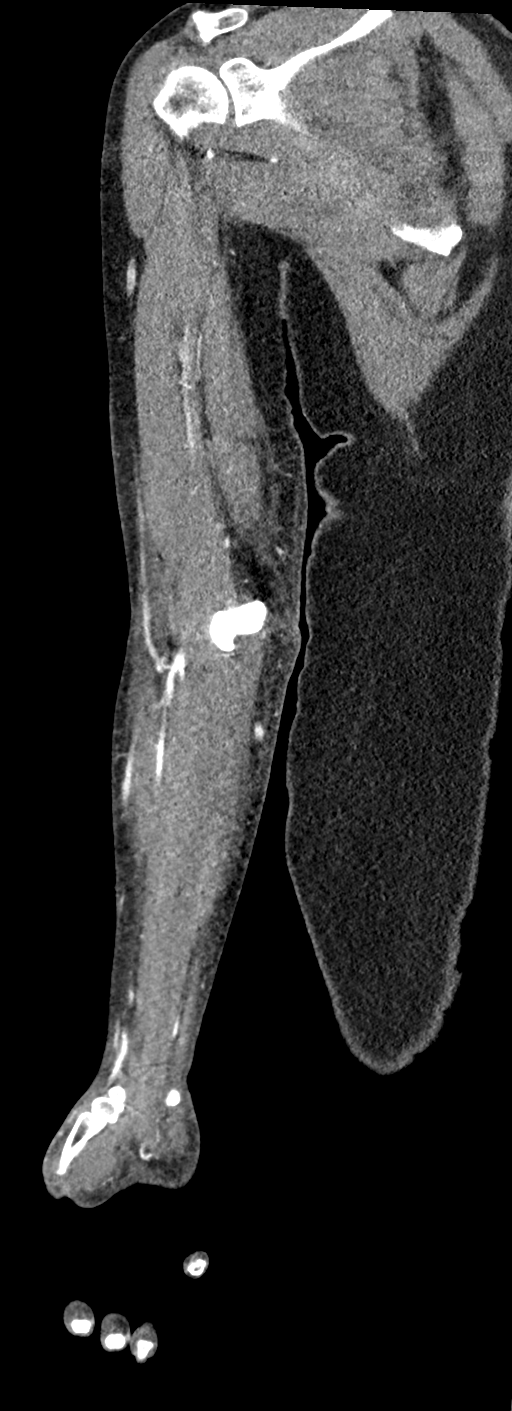
[im 57/76  soft-tissue]
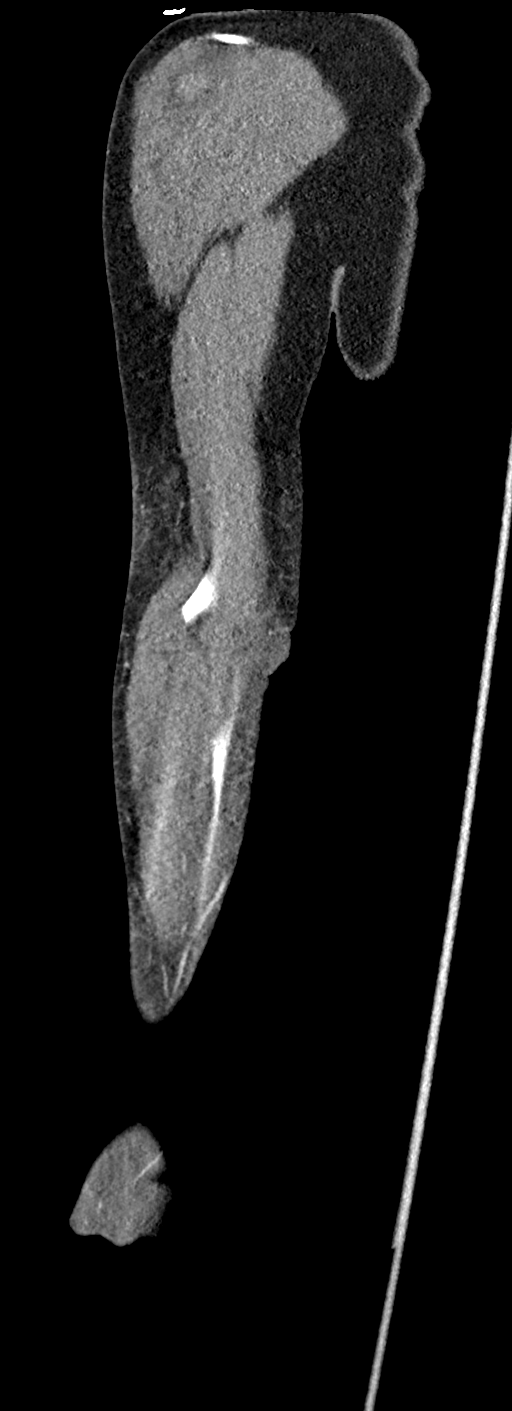

[12 of 46 positions shown; findings below may reference images not displayed]

FINDINGS: There is no evidence of acute arterial thrombosis. The arterial
vasculature appears fully patent. No calcific atherosclerotic
disease is seen.

Mild diffuse soft tissue edema is noted about the upper arm,
posterior elbow and mid to distal forearm. Focal blisters are noted
along the volar aspect of the forearm, measuring up to 2.5 cm in
size. No abnormal focal fluid collections are otherwise seen. There
is no evidence of abscess.

The musculature is otherwise grossly unremarkable in appearance. No
acute osseous abnormalities are seen. Subcortical cysts are noted
along the lateral right humeral head.

Review of the MIP images confirms the above findings.
IMPRESSION: 1. No evidence of acute arterial thrombosis. No calcific
atherosclerotic disease seen.
2. Diffuse soft tissue edema about the upper arm, posterior elbow
and mid to distal forearm. Focal blisters along the volar aspect of
the forearm, measuring up to 2.5 cm in size.
